# Patient Record
Sex: Male | Born: 1995 | Race: White | Hispanic: No | Marital: Single | Smoking: Never smoker
Health system: Southern US, Community
[De-identification: ages and names within clinical notes are randomized; demographics above are authoritative.]

## PROBLEM LIST (undated history)

## (undated) DIAGNOSIS — G919 Hydrocephalus, unspecified: Secondary | ICD-10-CM

## (undated) DIAGNOSIS — G809 Cerebral palsy, unspecified: Secondary | ICD-10-CM

## (undated) DIAGNOSIS — R278 Other lack of coordination: Secondary | ICD-10-CM

## (undated) DIAGNOSIS — E109 Type 1 diabetes mellitus without complications: Secondary | ICD-10-CM

## (undated) DIAGNOSIS — F419 Anxiety disorder, unspecified: Secondary | ICD-10-CM

## (undated) DIAGNOSIS — Z982 Presence of cerebrospinal fluid drainage device: Secondary | ICD-10-CM

## (undated) DIAGNOSIS — H509 Unspecified strabismus: Secondary | ICD-10-CM

## (undated) HISTORY — DX: Unspecified strabismus: H50.9

## (undated) HISTORY — DX: Other lack of coordination: R27.8

## (undated) HISTORY — PX: VENTRICULOPERITONEAL SHUNT: SHX204

## (undated) HISTORY — DX: Hydrocephalus, unspecified: G91.9

## (undated) HISTORY — PX: TENDON RELEASE: SHX230

## (undated) HISTORY — PX: EYE SURGERY: SHX253

## (undated) HISTORY — DX: Anxiety disorder, unspecified: F41.9

## (undated) HISTORY — DX: Presence of cerebrospinal fluid drainage device: Z98.2

## (undated) HISTORY — DX: Cerebral palsy, unspecified: G80.9

## (undated) HISTORY — DX: Type 1 diabetes mellitus without complications: E10.9

---
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
## 2015-07-15 ENCOUNTER — Emergency Department (HOSPITAL_COMMUNITY): Payer: BLUE CROSS/BLUE SHIELD

## 2015-07-15 ENCOUNTER — Emergency Department (HOSPITAL_COMMUNITY)
Admission: EM | Admit: 2015-07-15 | Discharge: 2015-07-15 | Disposition: A | Discharge status: Home / Self Care | Payer: BLUE CROSS/BLUE SHIELD | Attending: Emergency Medicine | Admitting: Emergency Medicine

## 2015-07-15 ENCOUNTER — Encounter (HOSPITAL_COMMUNITY): Payer: Self-pay | Admitting: Emergency Medicine

## 2015-07-15 DIAGNOSIS — E109 Type 1 diabetes mellitus without complications: Secondary | ICD-10-CM | POA: Insufficient documentation

## 2015-07-15 DIAGNOSIS — Z79899 Other long term (current) drug therapy: Secondary | ICD-10-CM | POA: Insufficient documentation

## 2015-07-15 DIAGNOSIS — Z982 Presence of cerebrospinal fluid drainage device: Secondary | ICD-10-CM | POA: Diagnosis not present

## 2015-07-15 DIAGNOSIS — Z880 Allergy status to penicillin: Secondary | ICD-10-CM | POA: Insufficient documentation

## 2015-07-15 DIAGNOSIS — F419 Anxiety disorder, unspecified: Secondary | ICD-10-CM | POA: Insufficient documentation

## 2015-07-15 DIAGNOSIS — Z794 Long term (current) use of insulin: Secondary | ICD-10-CM | POA: Diagnosis not present

## 2015-07-15 DIAGNOSIS — Z8669 Personal history of other diseases of the nervous system and sense organs: Secondary | ICD-10-CM | POA: Diagnosis not present

## 2015-07-15 DIAGNOSIS — R569 Unspecified convulsions: Secondary | ICD-10-CM | POA: Diagnosis not present

## 2015-07-15 LAB — BASIC METABOLIC PANEL
ANION GAP: 11 (ref 5–15)
BUN: 11 mg/dL (ref 6–20)
CO2: 21 mmol/L — ABNORMAL LOW (ref 22–32)
Calcium: 8.4 mg/dL — ABNORMAL LOW (ref 8.9–10.3)
Chloride: 108 mmol/L (ref 101–111)
Creatinine, Ser: 0.62 mg/dL (ref 0.61–1.24)
GFR calc Af Amer: >60 mL/min (ref >60)
GLUCOSE: 89 mg/dL (ref 65–99)
POTASSIUM: 3.6 mmol/L (ref 3.5–5.1)
SODIUM: 140 mmol/L (ref 135–145)

## 2015-07-15 LAB — CBC WITH DIFFERENTIAL/PLATELET
Basophils Absolute: 0 10*3/uL (ref 0.0–0.1)
Basophils Relative: 0 %
EOS PCT: 0 %
Eosinophils Absolute: 0 10*3/uL (ref 0.0–0.7)
HCT: 47.7 % (ref 39.0–52.0)
HEMOGLOBIN: 17.3 g/dL — AB (ref 13.0–17.0)
LYMPHS PCT: 15 %
Lymphs Abs: 2.1 10*3/uL (ref 0.7–4.0)
MCH: 30.7 pg (ref 26.0–34.0)
MCHC: 36.3 g/dL — ABNORMAL HIGH (ref 30.0–36.0)
MCV: 84.7 fL (ref 78.0–100.0)
MONO ABS: 1.4 10*3/uL — AB (ref 0.1–1.0)
Monocytes Relative: 10 %
NEUTROS PCT: 75 %
Neutro Abs: 10.2 10*3/uL — ABNORMAL HIGH (ref 1.7–7.7)
PLATELETS: 201 10*3/uL (ref 150–400)
RBC: 5.63 MIL/uL (ref 4.22–5.81)
RDW: 12.8 % (ref 11.5–15.5)
WBC: 13.7 10*3/uL — AB (ref 4.0–10.5)

## 2015-07-15 LAB — RAPID URINE DRUG SCREEN, HOSP PERFORMED
AMPHETAMINES: NOT DETECTED
BENZODIAZEPINES: NOT DETECTED
Barbiturates: NOT DETECTED
Cocaine: NOT DETECTED
OPIATES: NOT DETECTED
Tetrahydrocannabinol: NOT DETECTED

## 2015-07-15 LAB — CBG MONITORING, ED
GLUCOSE-CAPILLARY: 226 mg/dL — AB (ref 65–99)
GLUCOSE-CAPILLARY: 140 mg/dL — AB (ref 65–99)
Glucose-Capillary: 104 mg/dL — ABNORMAL HIGH (ref 65–99)
Glucose-Capillary: 100 mg/dL — ABNORMAL HIGH (ref 65–99)
Glucose-Capillary: 118 mg/dL — ABNORMAL HIGH (ref 65–99)

## 2015-07-15 LAB — URINALYSIS, ROUTINE W REFLEX MICROSCOPIC
Bilirubin Urine: NEGATIVE
Glucose, UA: 250 mg/dL — AB
Ketones, ur: 15 mg/dL — AB
LEUKOCYTES UA: NEGATIVE
Nitrite: NEGATIVE
PROTEIN: 100 mg/dL — AB
Specific Gravity, Urine: 1.025 (ref 1.005–1.030)
pH: 5.5 (ref 5.0–8.0)

## 2015-07-15 LAB — URINE MICROSCOPIC-ADD ON: WBC, UA: NONE SEEN WBC/hpf (ref 0–5)

## 2015-07-15 MED ORDER — VALACYCLOVIR HCL 1 G PO TABS: 1000.0000 mg | ORAL_TABLET | Freq: Three times a day (TID) | ORAL | Status: AC

## 2015-07-15 MED ORDER — LEVETIRACETAM 500 MG PO TABS: 500.0000 mg | ORAL_TABLET | Freq: Two times a day (BID) | ORAL | Status: AC

## 2015-07-15 MED ORDER — ACETAMINOPHEN 325 MG PO TABS
650.0000 mg | ORAL_TABLET | Freq: Once | ORAL | Status: AC
  Administered 2015-07-15: 650 mg via ORAL
  Filled 2015-07-15: qty 2

## 2015-07-15 MED ORDER — IOHEXOL 300 MG/ML  SOLN
75.0000 mL | Freq: Once | INTRAMUSCULAR | Status: AC | PRN
  Administered 2015-07-15: 75 mL via INTRAVENOUS

## 2015-07-15 MED ORDER — VALACYCLOVIR HCL 500 MG PO TABS
1000.0000 mg | ORAL_TABLET | Freq: Once | ORAL | Status: AC
  Administered 2015-07-15: 1000 mg via ORAL
  Filled 2015-07-15: qty 2

## 2015-07-15 MED ORDER — SODIUM CHLORIDE 0.9 % IV SOLN
1000.0000 mg | Freq: Once | INTRAVENOUS | Status: AC
  Administered 2015-07-15: 1000 mg via INTRAVENOUS
  Filled 2015-07-15: qty 10

## 2015-07-15 MED ORDER — LORAZEPAM 2 MG/ML IJ SOLN
0.2500 mg | Freq: Once | INTRAMUSCULAR | Status: DC
Start: 2015-07-15 — End: 2015-07-15

## 2015-07-15 NOTE — ED Notes (Signed)
Pt CBG, 100. Nurse was notified. 

## 2015-07-15 NOTE — Discharge Instructions (Signed)
Keppra and Valtrex as prescribed.    Follow-up with neurology in the next week.   Seizure, Adult  A seizure is abnormal electrical activity in the brain. Seizures usually last from 30 seconds to 2 minutes. There are various types of seizures. Before a seizure, you may have a warning sensation (aura) that a seizure is about to occur. An aura may include the following symptoms:   Fear or anxiety.  Nausea.  Feeling like the room is spinning (vertigo).  Vision changes, such as seeing flashing lights or spots. Common symptoms during a seizure include:  A change in attention or behavior (altered mental status).  Convulsions with rhythmic jerking movements.  Drooling.  Rapid eye movements.  Grunting.  Loss of bladder and bowel control.  Bitter taste in the mouth.  Tongue biting. After a seizure, you may feel confused and sleepy. You may also have an injury resulting from convulsions during the seizure. HOME CARE INSTRUCTIONS   If you are given medicines, take them exactly as prescribed by your health care provider.  Keep all follow-up appointments as directed by your health care provider.  Do not swim or drive or engage in risky activity during which a seizure could cause further injury to you or others until your health care provider says it is OK.  Get adequate rest.  Teach friends and family what to do if you have a seizure. They should:  Lay you on the ground to prevent a fall.  Put a cushion under your head.  Loosen any tight clothing around your neck.  Turn you on your side. If vomiting occurs, this helps keep your airway clear.  Stay with you until you recover.  Know whether or not you need emergency care. SEEK IMMEDIATE MEDICAL CARE IF:  The seizure lasts longer than 5 minutes.  The seizure is severe or you do not wake up immediately after the seizure.  You have an altered mental status after the seizure.  You are having more frequent or worsening  seizures. Someone should drive you to the emergency department or call local emergency services (911 in [place] MAKE SURE YOU:  Understand these instructions.  Will watch your condition.  Will get help right away if you are not doing well or get worse.   This information is not intended to replace advice given to you by your health care provider. Make sure you discuss any questions you have with your health care provider.   Document Released: 06/23/2000 Document Revised: 07/17/2014 Document Reviewed: 02/05/2013 Elsevier Interactive Patient Education [employer]2016 [employer]

## 2015-07-15 NOTE — ED Notes (Addendum)
Patient arrived to ED via [facility]. EMS reports patient got out of bed approx 0300, fell down the stairs. +LOC. Patient postictal 12-15 minutes following seizure. Denied neck/back pain. No neuro deficits noted. Patient's pulse 150s upon EMS arrival. Increased to 180s when patient sat up. Decreased to 110s-120s. Patient is diabetic, and has an insulin pump. Mother discontinued insulin pump & has in her possession. 18 gauge IV in L hand. EMS administered NS 500 ml. BP 157/81, Pulse 130, 98% on room air.

## 2015-07-15 NOTE — ED Notes (Signed)
Pt is flushed, warm to touch-- temp- 98.5

## 2015-07-15 NOTE — ED Notes (Signed)
Pt CBG, 140. 

## 2015-07-15 NOTE — Progress Notes (Signed)
Unable to obtain documentation on shunt to determine if the patient could safely have a MRI. Dr Chestine Sporelark did not feel comfortable with us performing the MRI without proper documentation. Dr. Lavon PaganiniNandigam was notified and exam cancelled.

## 2015-07-15 NOTE — Progress Notes (Signed)
EEG Completed; Results Pending  

## 2015-07-15 NOTE — ED Notes (Signed)
Neurologist at bedside, talking with parents and patient.

## 2015-07-15 NOTE — ED Notes (Signed)
Parents brought medical records, MRI notified with lot # -- [id number] catalog [phone]#--[id number]

## 2015-07-15 NOTE — ED Notes (Addendum)
Pt CBG, 118. Nurse was notified. 

## 2015-07-15 NOTE — Consult Note (Signed)
Requesting Physician: Dr.  Vivia EwingNanvati    Reason fo consultation:    To evaluate and manage seizures  HPI:                                                                                                                                         Derek Waters is an 20 y.o. male patient who presented to the emergency room with his parents and grandmother. Patient has a PP shunt catheter in place, it was put in in infancy but is not connected to a peritonial shunt. No history of prior seizures lifetime except for at age 66 weeks infancy had some seizures which led to the shunt catheter placement at that time. Patient is diabetic with an insulin pump. Early this morning, his family had some loud noises in his room. They found him having generalized convulsive seizures lasting 2-3 minutes. When the EMTs arrived blood sugar was reportedly around 120. He is back to baseline by the time he arrived in the ER. Patient denies any new neurological symptoms no recent fevers headaches and neck pain, focal vision speech or sensorimotor symptoms. He has been having some upper history infection for the past few days with feeling of fullness and pain in his ears bilaterally. Denies any facial weakness or vertigo dizziness or gait instability. No recent head trauma.  Other than insulin for diabetes, is not on any other prescription medications. Denies any alcohol use or substance abuse. Lives with his parents at home. Currently does not have any active neurology or neurosurgery follow-up.    Past Medical History  Diagnosis Date  . Premature birth   . Presence of ventricular shunt   . Diabetes mellitus type I (HCC)   . Hydrocephalus   . Cerebral palsy (HCC)   . Anxiety   . Dyspraxia   . Dysgraphia   . Strabismus     Past Surgical History  Procedure Laterality Date  . Ventriculoperitoneal shunt    . Eye surgery    . Tendon release      Family History  Problem Relation Age of Onset  . Diabetes Mother     dx age  20 on pump  . Thyroid disease Mother     hypothyroid  . Hypertension Mother   . OCD Mother   . Hypertension Father   . Thyroid disease Maternal Grandmother     hyperthyroid  . Heart disease Maternal Grandmother     CHF  . Hypertension Maternal Grandmother   . Diabetes Maternal Grandmother     type 2  . Cancer Maternal Grandfather     lung  . Hypertension Maternal Grandfather   . Heart disease Maternal Grandfather     MI x 9 first in 3640s  . Thyroid disease Paternal Grandmother   . Diabetes Paternal Grandfather     type 2  . Hypertension Paternal Grandfather    Social History:  reports that he has never smoked. He has never used smokeless tobacco. His alcohol and drug histories are not on file.  Allergies:  Allergies  Allergen Reactions  . Penicillins Rash    Has patient had a PCN reaction causing immediate rash, facial/tongue/throat swelling, SOB or lightheadedness with hypotension: Yes Has patient had a PCN reaction causing severe rash involving mucus membranes or skin necrosis: No Has patient had a PCN reaction that required hospitalization No Has patient had a PCN reaction occurring within the last 10 years: Yes If all of the above answers are "NO", then may proceed with Cephalosporin use.   Marland Kitchen Amoxicillin Rash    Medications:                                                                                                                          Current facility-administered medications:  .  LORazepam (ATIVAN) injection 0.25 mg, 0.25 mg, Intravenous, Once, Deauna Yaw Daniel Nones, MD  Current outpatient prescriptions:  .  busPIRone (BUSPAR) 30 MG tablet, Take 30 mg by mouth 2 (two) times daily.  , Disp: , Rfl:  .  cetirizine (ZYRTEC) 10 MG tablet, Take 10 mg by mouth daily., Disp: , Rfl:  .  insulin aspart (NOVOLOG) 100 UNIT/ML injection, Use in insulin pump as directed. Please provide #90 day supply., Disp: 45 mL, Rfl: 1   ROS:                                                                                                                                        History obtained from the patient  General ROS: negative for - chills, fatigue, fever, night sweats, weight gain or weight loss Psychological ROS: negative for - behavioral disorder, hallucinations, memory difficulties, mood swings or suicidal ideation Ophthalmic ROS: negative for - blurry vision, double vision, eye pain or loss of vision ENT ROS: Positive for mild sore throat, upper history infection and fullness and pain in the ears , negative for - epistaxis, tinnitus or vertigo Allergy and Immunology ROS: negative for - hives or itchy/watery eyes Hematological and Lymphatic ROS: negative for - bleeding problems, bruising or swollen lymph nodes Endocrine ROS: negative for - galactorrhea, hair pattern changes, polydipsia/polyuria or temperature intolerance Respiratory ROS: negative for - cough, hemoptysis, shortness of breath or wheezing Cardiovascular ROS: negative for - chest pain, dyspnea on exertion, edema or irregular  heartbeat Gastrointestinal ROS: negative for - abdominal pain, diarrhea, hematemesis, nausea/vomiting or stool incontinence Genito-Urinary ROS: negative for - dysuria, hematuria, incontinence or urinary frequency/urgency Musculoskeletal ROS: negative for - joint swelling or muscular weakness Neurological ROS: as noted in HPI Dermatological ROS: negative for rash and skin lesion changes  Neurologic Examination:                                                                                                      Blood pressure 141/84, pulse 111, temperature 98.8 F (37.1 C), temperature source Oral, resp. rate 12, height 5\' 7"  (1.702 m), weight 74.844 kg (165 lb), SpO2 99 %.  Evaluation of higher integrative functions including: Level of alertness: Alert,  Oriented to time, place and person Recent and remote memory - intact   Attention span and concentration  - intact    Speech: fluent, no evidence of dysarthria or aphasia noted.  Test the following cranial nerves: 2-12 grossly intact Motor examination: Normal tone, bulk, full 5/5 motor strength in all 4 extremities Examination of sensation : Normal and symmetric sensation to pinprick in all 4 extremities and on face Examination of deep tendon reflexes: 2+, normal and symmetric in all extremities, normal plantars bilaterally Test coordination: Normal finger nose testing, with no evidence of limb appendicular ataxia or abnormal involuntary movements or tremors noted.  Gait: Deferred   Lab Results: Basic Metabolic Panel:  Recent Labs Lab 07/15/15 0548  NA 140  K 3.6  CL 108  CO2 21*  GLUCOSE 89  BUN 11  CREATININE 0.62  CALCIUM 8.4*    Liver Function Tests: No results for input(s): AST, ALT, ALKPHOS, BILITOT, PROT, ALBUMIN in the last 168 hours. No results for input(s): LIPASE, AMYLASE in the last 168 hours. No results for input(s): AMMONIA in the last 168 hours.  CBC:  Recent Labs Lab 07/15/15 0548  WBC 13.7*  NEUTROABS 10.2*  HGB 17.3*  HCT 47.7  MCV 84.7  PLT 201    Cardiac Enzymes: No results for input(s): CKTOTAL, CKMB, CKMBINDEX, TROPONINI in the last 168 hours.  Lipid Panel: No results for input(s): CHOL, TRIG, HDL, CHOLHDL, VLDL, LDLCALC in the last 168 hours.  CBG:  Recent Labs Lab 07/15/15 0528 07/15/15 0748 07/15/15 0859  GLUCAP 104* 100* 118*    Microbiology: No results found for this or any previous visit.  aging: Dg Skull 1-3 Views  07/15/2015  CLINICAL DATA:  Seizure.  Shunt series EXAM: SKULL - 1-3 VIEW COMPARISON:  None. FINDINGS: Right parietal shunt tubing. There is a lucent valve overlying the right temporal bone. There is a lucent segment at the C2 level as described on the cervical spine x-ray series. IMPRESSION: Lucent segment of shunt catheter at the C2 level may be a fractured segment of catheter. Electronically Signed   By: Marlan Palau M.D.    On: 07/15/2015 07:35   Dg Chest 1 View  07/15/2015  CLINICAL DATA:  Seizure.  Shunt series EXAM: CHEST 1 VIEW COMPARISON:  None. FINDINGS: Shunt tubing over the right chest with calcification around the tubing. Lucent  segment of tubing is present which may be due to a valve or possibly disconnected tubing. This overlies the right mid chest. Lungs are clear without infiltrate or effusion. Heart size is normal. IMPRESSION: Calcification around the shunt tubing in the right chest. Lucent segment in the tubing may represent a valve or disconnected tubing. No prior studies available for comparison. Electronically Signed   By: Marlan Palau M.D.   On: 07/15/2015 07:30   Dg Cervical Spine 1 View  07/15/2015  CLINICAL DATA:  Seizure.  Shunt series EXAM: CERVICAL SPINE 1 VIEW COMPARISON:  None. FINDINGS: Shunt tubing overlies the right neck. There is a valve overlying the right temporal bone. There is a lucent segment of tubing at the C2 level which may be disconnected or fractured tubing. No acute bony abnormality. IMPRESSION: Lucent segment of tubing at the C2 level may be disconnected or fractured tubing. Electronically Signed   By: Marlan Palau M.D.   On: 07/15/2015 07:32   Dg Abd 1 View  07/15/2015  CLINICAL DATA:  Status post fall at 3 a.m. this morning. Patient reports loss of consciousness. Ventriculostomy shunt catheter. EXAM: ABDOMEN - 1 VIEW COMPARISON:  None. FINDINGS: Ventriculostomy shunt catheter tip is seen in the left upper quadrant of the abdomen projecting over the left eleventh rib. Imaged tubing is intact. Bowel gas pattern is normal. IMPRESSION: Shunt catheter tubing projects in the left upper quadrant the abdomen and is intact. Electronically Signed   By: Drusilla Kanner M.D.   On: 07/15/2015 07:34   Ct Head Wo Contrast  07/15/2015  CLINICAL DATA:  Seizure EXAM: CT HEAD WITHOUT CONTRAST TECHNIQUE: Contiguous axial images were obtained from the base of the skull through the vertex without  intravenous contrast. COMPARISON:  None. FINDINGS: Right parietal ventricular shunt catheter extends into the right lateral ventricle. There is volume loss in the right parietal white matter with enlargement of the atrium of the right lateral ventricle compatible with chronic periventricular leukomalacia. Negative for hydrocephalus Negative for acute infarct.  Negative for hemorrhage or mass lesion. Calvarium intact. IMPRESSION: No acute abnormality.  No prior studies available for comparison. Right parietal ventricular catheter in the right lateral ventricle. 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 Electronically Signed   By: Marlan Palau M.D.   On: 07/15/2015 07:18     Assessment and plan:   Derek Waters is an 20 y.o. male patient who presented to the emergency room after a convulsive generalized seizure this morning witnessed by his parents. He does have increased risk for seizures based on the previous ventricular catheter placement with some mild encephalomalacia around the catheter noted on the CT brain. This was placed at age 37 weeks, apparently had a seizure at that time and was believed to have hydrocephalus. But the catheter is not connected to any paratonia of shunt and it ends blindly in the neck. Patient had seizures previously to his knowledge, currently not on any antiepileptic medications, no active neurology or neurosurgery follow-up.  The potential trigger for this seizure is not clearly known. He reported having upper history infection with pain and fullness in bilateral ear canals. 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 Parents reported that it is not unusual for him to stay up late at night's with some sleep deprivation, although denied any significant stressors or sleep deprivation recently. Given the risk of seizures based on the intracranial pathology, recommend a loading dose of Keppra 1 g to be given in the  ER, and a  prescription for Keppra 500 mg twice a day to be taken at home. He is advised to follow-up with outpatient neurology for continued monitoring of the seizure medication. Recommend an EEG and MRI of the brain with and without contrast for further neurodiagnostic workup.  EEG has been completed in the ER, which did not show any definite evidence of abnormal epileptiform discharges or electrographic seizures. MRI is still pending. 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  If MRI does not show any significant intracranial pathology, patient can be discharged home from the ER in stable condition, with a prescription for Keppra 500 mg twice a day, and follow-up with outpatient neurology in 1-2 weeks. No driving for at least 6 months until cleared by his neurologist or PCP.  Case discussed with ER physician.

## 2015-07-15 NOTE — ED Notes (Signed)
Pt CBG, 226. 

## 2015-07-15 NOTE — ED Notes (Signed)
Pt returned from xray-- parents at bedside,

## 2015-07-15 NOTE — Procedures (Signed)
ELECTROENCEPHALOGRAM REPORT   Patient: Derek Waters      Age: 20 y.o.        Sex: male Referring Physician: Dr Judd Lienelo ED Report Date:  07/15/2015        Interpreting Physician: Omelia BlackwaterSUMNER, Wreatha Sturgeon JUSTIN  History: Derek ShawlWilliam Camire is an 20 y.o. male admitted with seizure activity, currently back to baseline  Medications:  I have reviewed the patient's current medications.  Conditions of Recording:  This is a 16 channel EEG carried out with the patient in the awake state.  Description:  The waking background activity consists of a low voltage, symmetrical, fairly well organized, 8 to 10 hx alpha activity, seen from the parieto-occipital and posterior temporal regions.  Low voltage fast activity, poorly organized, is seen anteriorly and is at times superimposed on more posterior regions. No focal slowing or epileptiform activity is noted.   Hyperventilation produced a mild to moderate buildup but failed to elicit any abnormalities. Intermittent photic stimulation was performed but failed to illicit any change in the tracing.     IMPRESSION: Normal electroencephalogram. There are no focal lateralizing or epileptiform features.   Elspeth Choeter Rommel Hogston, DO [facility] [phone]  If 7pm- 7am, please page neurology on call as listed in [facility]ION. 07/15/2015, 11:44 AM

## 2015-07-17 NOTE — ED Provider Notes (Signed)
Care assumed at shift change. Patient presented here after a seizure. He has a history of a VP shunt that was placed when he was an infant and has not been revised since. His workup reveals no abnormality thus far. In consultation with neurology, he has undergone an EEG which was unremarkable. He was also to undergo an MRI, however this test is not compatible with the type of shunt patient has. For this reason he underwent a CT scan of the head with contrast. This was negative. Per Dr. Lavon PaganiniNandigam, he is appropriate for discharge.  Geoffery Lyonsouglas Zyionna Pesce, MD 07/17/15 [phone]

## 2015-07-23 NOTE — ED Provider Notes (Signed)
CSN: [id number]     Arrival date & time 07/15/15  [id number] History   First MD Initiated Contact with Patient 07/15/15 [phone]     Chief Complaint  Patient presents with  . Seizures     (Consider location/radiation/quality/duration/timing/severity/associated sxs/prior Treatment) HPI Comments: 20 y.o. male patient who presented to the emergency room with his parents with cc of seizure. 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 No history of prior seizures lifetime except for at age 60 weeks infancy had some seizures which led to the shunt catheter placement at that time. Mother reports that early in the morning she heard a loud noise, and when she went to check the patient was having generalized seizure like activity lasting 2-3 minutes. When the EMTs arrived blood sugar was reportedly around 120. He was confused initially, but now he is back to baseline.   Patient reports waking up and feeling like his sugar was low. He reports recalling making his way to the kitchen to drink something prior to the episode. Patient denies any new neurological symptoms no recent fevers headaches and neck pain, focal vision speech or sensorimotor symptoms. Denies any facial weakness or vertigo dizziness or gait instability. No recent head trauma.  Other than insulin for diabetes, is not on any other prescription medications. Denies any alcohol use or substance abuse.   ROS 10 Systems reviewed and are negative for acute change except as noted in the HPI.     Patient is a 20 y.o. male presenting with seizures. The history is provided by the patient and a parent.  Seizures   Past Medical History  Diagnosis Date  . Premature birth   . Presence of ventricular shunt   . Diabetes mellitus type I (HCC)   . Hydrocephalus   . Cerebral palsy (HCC)   . Anxiety   . Dyspraxia   . Dysgraphia   . Strabismus    Past Surgical History  Procedure  Laterality Date  . Ventriculoperitoneal shunt    . Eye surgery    . Tendon release     Family History  Problem Relation Age of Onset  . Diabetes Mother     dx age 20 on pump  . Thyroid disease Mother     hypothyroid  . Hypertension Mother   . OCD Mother   . Hypertension Father   . Thyroid disease Maternal Grandmother     hyperthyroid  . Heart disease Maternal Grandmother     CHF  . Hypertension Maternal Grandmother   . Diabetes Maternal Grandmother     type 2  . Cancer Maternal Grandfather     lung  . Hypertension Maternal Grandfather   . Heart disease Maternal Grandfather     MI x 9 first in 7540s  . Thyroid disease Paternal Grandmother   . Diabetes Paternal Grandfather     type 2  . Hypertension Paternal Grandfather    Social History  Substance Use Topics  . Smoking status: Never Smoker   . Smokeless tobacco: Never Used  . Alcohol Use: None    Review of Systems  Neurological: Positive for seizures.      Allergies  Penicillins and Amoxicillin  Home Medications   Prior to Admission medications   Medication Sig Start Date End Date Taking? Authorizing Provider  busPIRone (BUSPAR) 30 MG tablet Take 30 mg by mouth 2 (two) times daily.     Yes  Historical Provider, MD  cetirizine (ZYRTEC) 10 MG tablet Take 10 mg by mouth daily.   Yes Historical Provider, MD  insulin aspart (NOVOLOG) 100 UNIT/ML injection Use in insulin pump as directed. Please provide #90 day supply. 10/27/11   David Stall, MD  levETIRAcetam (KEPPRA) 500 MG tablet Take 1 tablet (500 mg total) by mouth 2 (two) times daily. 07/15/15   Geoffery Lyons, MD  valACYclovir (VALTREX) 1000 MG tablet Take 1 tablet (1,000 mg total) by mouth 3 (three) times daily. 07/15/15 07/29/15  Geoffery Lyons, MD   BP 109/59 mmHg  Pulse 98  Temp(Src) 98.8 F (37.1 C) (Oral)  Resp 22  Ht 5\' 7"  (1.702 m)  Wt 165 lb (74.844 kg)  BMI 25.84 kg/m2  SpO2 97% Physical Exam  Constitutional: He is oriented to person, place, and  time. He appears well-developed and well-nourished.  HENT:  Head: Normocephalic and atraumatic.  Eyes: EOM are normal. Pupils are equal, round, and reactive to light.  Neck: Normal range of motion. Neck supple. No JVD present.  Cardiovascular: Normal rate and regular rhythm.   Pulmonary/Chest: Effort normal and breath sounds normal. No respiratory distress. He has no wheezes.  Abdominal: Soft. Bowel sounds are normal. He exhibits no distension. There is no tenderness. There is no rebound and no guarding.  Neurological: He is alert and oriented to person, place, and time. No cranial nerve deficit. Coordination normal.  NIHSS - 0 No objective sensory deficits, Motor strength upper and lower extremity 4+ and equal Normal cerebellar exam  Skin: Skin is warm and dry.  Vitals reviewed.   ED Course  Procedures (including critical care time) Labs Review Labs Reviewed  CBC WITH DIFFERENTIAL/PLATELET - Abnormal; Notable for the following:    WBC 13.7 (*)    Hemoglobin 17.3 (*)    MCHC 36.3 (*)    Neutro Abs 10.2 (*)    Monocytes Absolute 1.4 (*)    All other components within normal limits  BASIC METABOLIC PANEL - Abnormal; Notable for the following:    CO2 21 (*)    Calcium 8.4 (*)    All other components within normal limits  URINALYSIS, ROUTINE W REFLEX MICROSCOPIC (NOT AT [facility]) - Abnormal; Notable for the following:    APPearance CLOUDY (*)    Glucose, UA 250 (*)    Hgb urine dipstick SMALL (*)    Ketones, ur 15 (*)    Protein, ur 100 (*)    All other components within normal limits  URINE MICROSCOPIC-ADD ON - Abnormal; Notable for the following:    Squamous Epithelial / LPF 0-5 (*)    Bacteria, UA RARE (*)    Casts HYALINE CASTS (*)    All other components within normal limits  CBG MONITORING, ED - Abnormal; Notable for the following:    Glucose-Capillary 104 (*)    All other components within normal limits  CBG MONITORING, ED - Abnormal; Notable for the following:     Glucose-Capillary 100 (*)    All other components within normal limits  CBG MONITORING, ED - Abnormal; Notable for the following:    Glucose-Capillary 118 (*)    All other components within normal limits  CBG MONITORING, ED - Abnormal; Notable for the following:    Glucose-Capillary 226 (*)    All other components within normal limits  CBG MONITORING, ED - Abnormal; Notable for the following:    Glucose-Capillary 140 (*)    All other components within normal limits  URINE RAPID DRUG  SCREEN, HOSP PERFORMED    Imaging Review No results found. I have personally reviewed and evaluated these images and lab results as part of my medical decision-making.   EKG Interpretation None      MDM   Final diagnoses:  Seizure (HCC)  S/P VP shunt   Pt comes in with cc of seizure like episode, witnessed. Hx of iddm and non functional VP shunt whi was placed when he was a neonate for a seizure like activity and hydrocephalus.  DDx: -Seizure disorder -Meningitis -Trauma -ICH -Electrolyte abnormality -Metabolic derangement -Stroke -Toxin induced seizures -Medication side effects -Hypoxia -Hypoglycemia   Hx and exam not indicative of any source. Neuro consulted and basic labs ordered. 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 - Neuro recs are to start keppra and valtrex. They will be getting EEG.  Dr. Judd Lien to f/u on the eventual rects. CT head and shunt series ordered - and dont show any acute pathology.    Derwood Kaplan, MD 07/23/15 1910

## 2015-08-19 ENCOUNTER — Ambulatory Visit: Payer: Commercial Indemnity | Admitting: Neurology

## 2016-07-22 IMAGING — CT CT HEAD W/ CM
1 of 2 series · 16 of 30 positions shown, 20 images · IV contrast (omnipaque)
Comparison: CT head without contrast earlier today.

CLINICAL DATA: Seizure today. Slight headache and dizziness.
History of hydrocephalus with cerebral palsy.

EXAM:
CT HEAD WITH CONTRAST
TECHNIQUE: Contiguous axial images were obtained from the base of the skull
through the vertex with intravenous contrast.
CONTRAST:  75mL OMNIPAQUE IOHEXOL 300 MG/ML  SOLN

[Series 2: head with · axial · 0.45mm/px · z∈[-174,-40]mm · 16 of 31 slices shown, 20 images]
[im 2/31  brain]
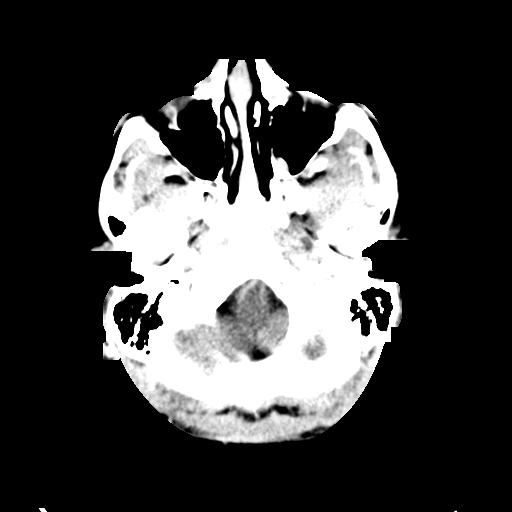
[im 2/31  bone]
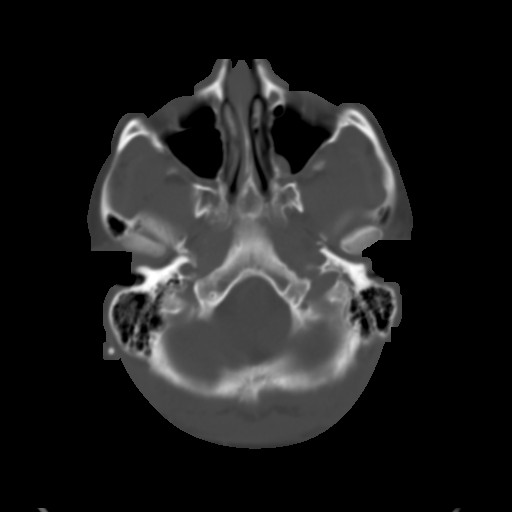
[im 4/31  brain]
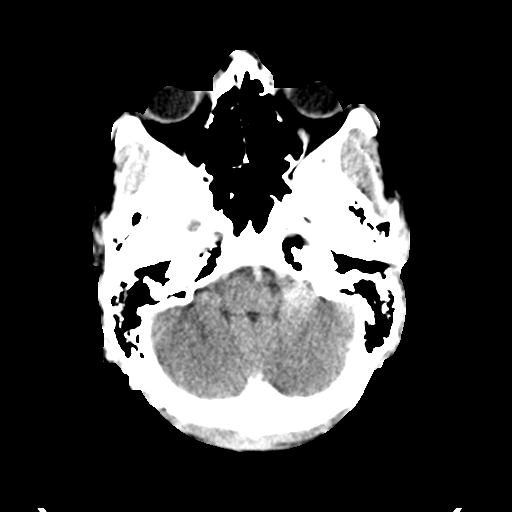
[im 5/31  brain]
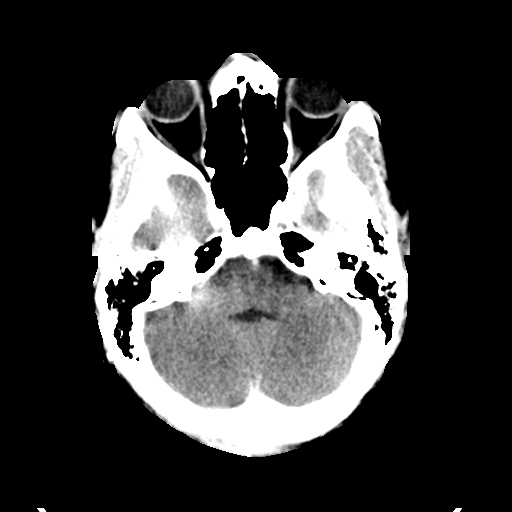
[im 7/31  brain]
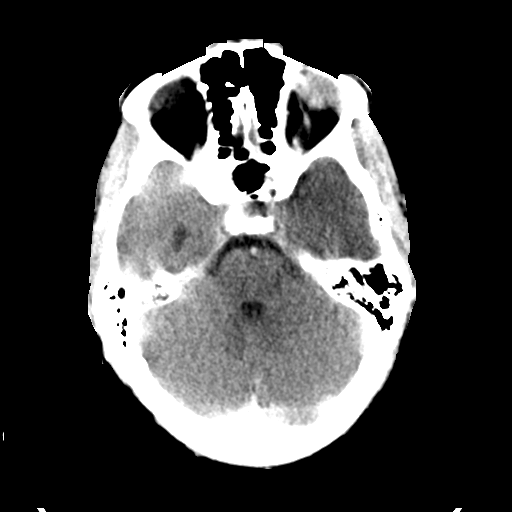
[im 10/31  brain]
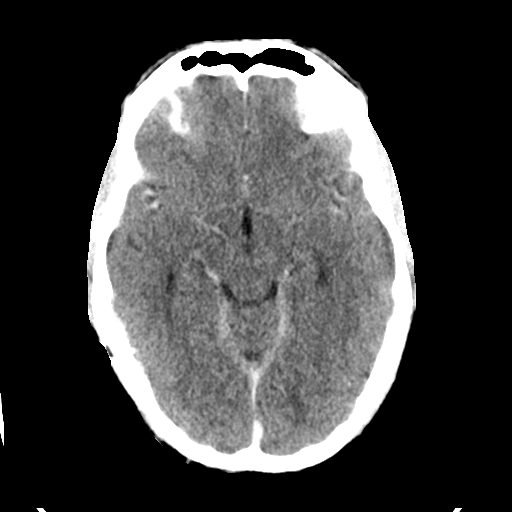
[im 10/31  bone]
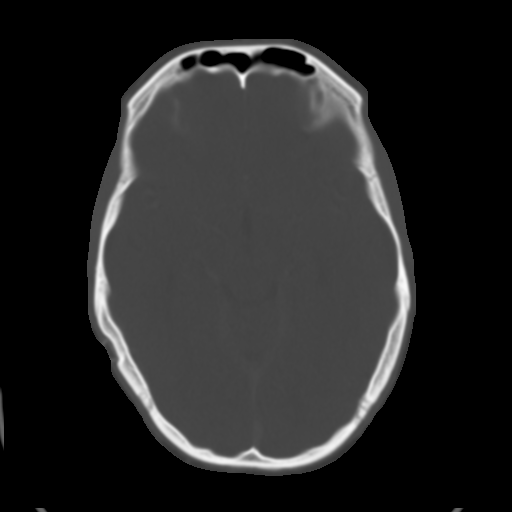
[im 12/31  brain]
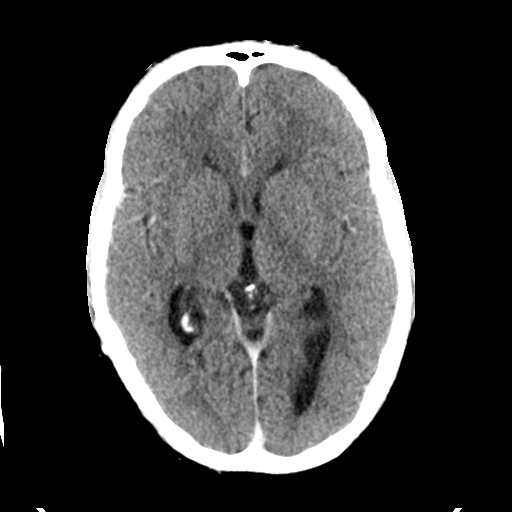
[im 13/31  brain]
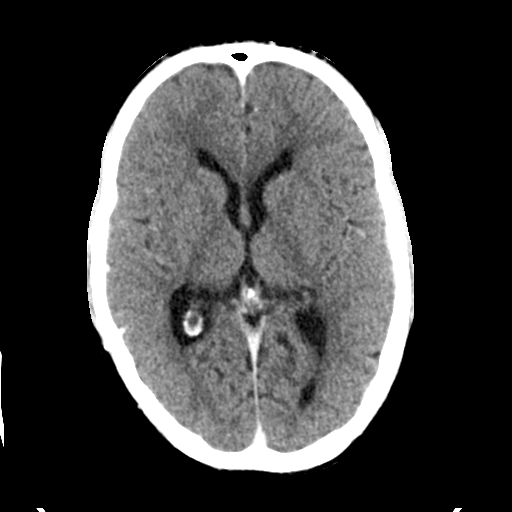
[im 15/31  brain]
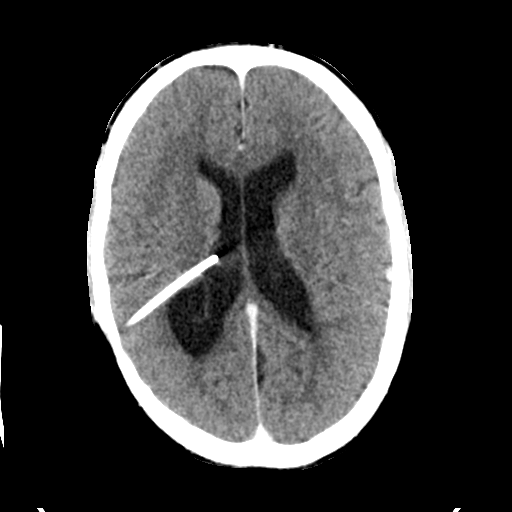
[im 16/31  brain]
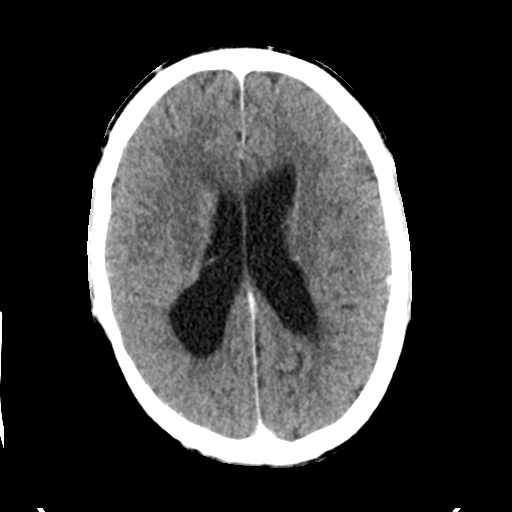
[im 16/31  bone]
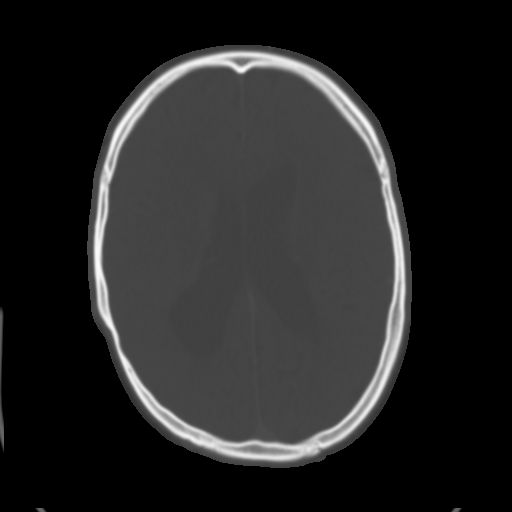
[im 18/31  brain]
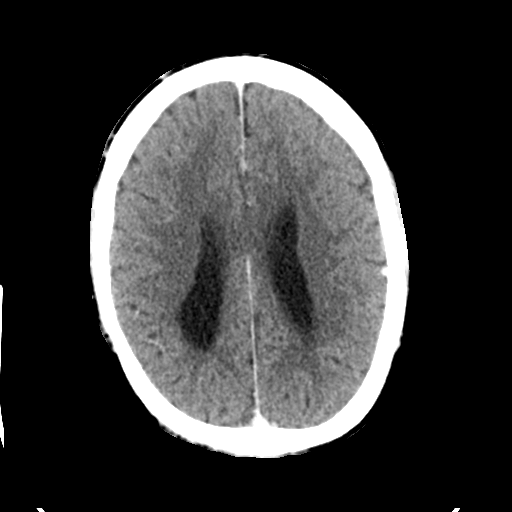
[im 19/31  brain]
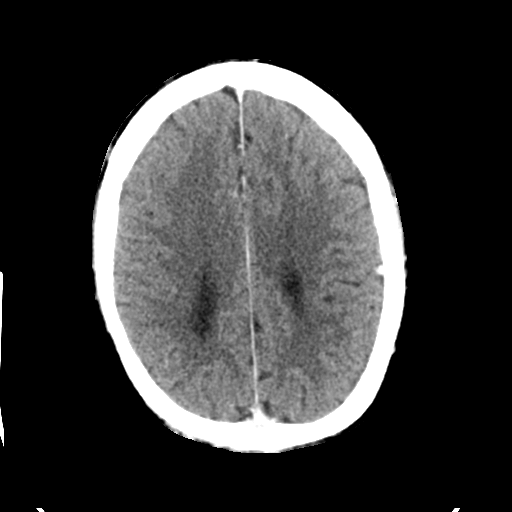
[im 21/31  brain]
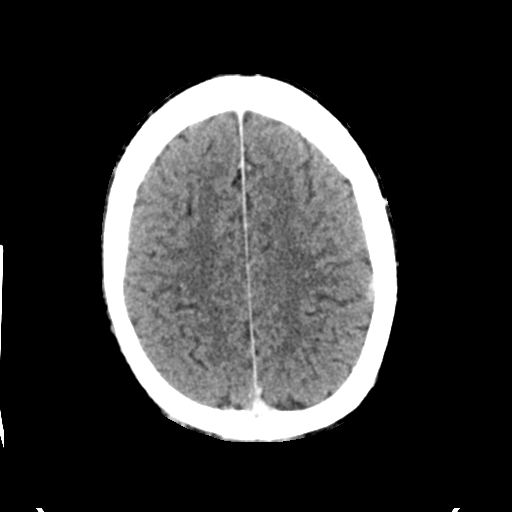
[im 24/31  brain]
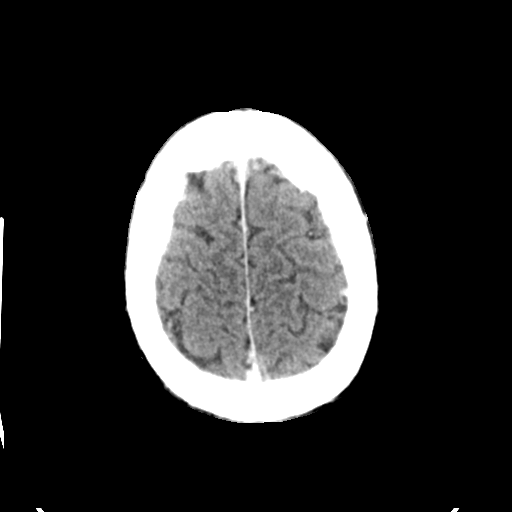
[im 24/31  bone]
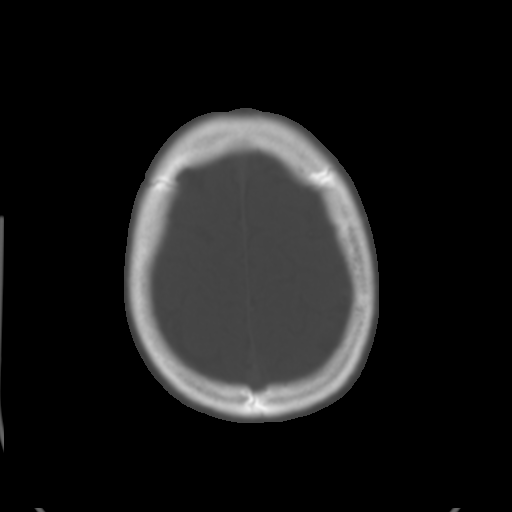
[im 26/31  brain]
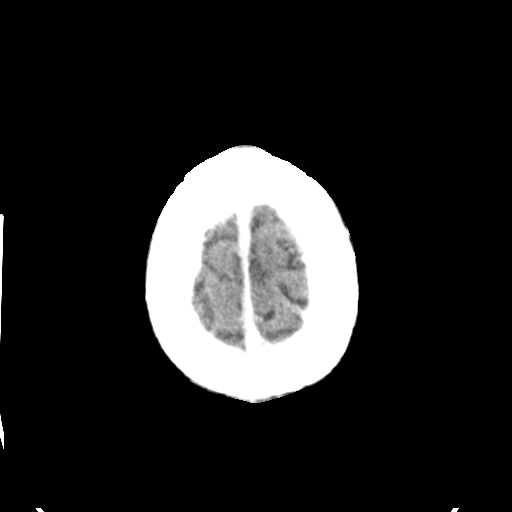
[im 27/31  brain]
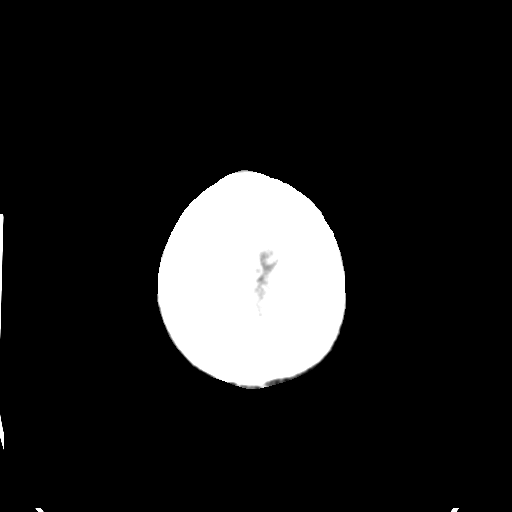
[im 29/31  brain]
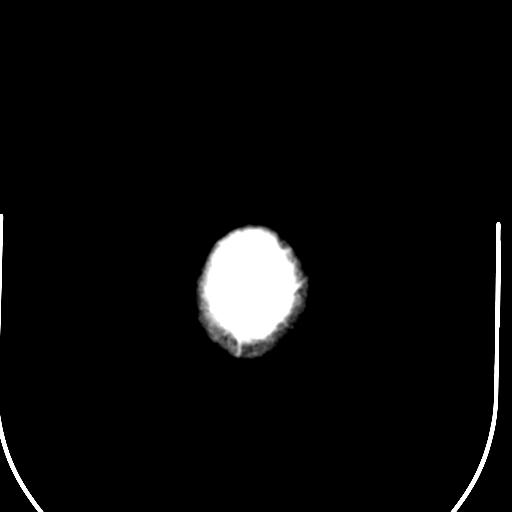

[16 of 30 positions shown; findings below may reference images not displayed]

FINDINGS: Post infusion, no abnormal enhancement of the brain or meninges.
Uncomplicated appearing RIGHT parietal shunt catheter, lying with
its tip in the RIGHT lateral ventricle. Moderate ventriculomegaly
could represent central atrophy, particularly with the prominence of
the parieto-occipital atria.

Extracranial soft tissues unremarkable. No obvious shunt tubing
discontinuity.
IMPRESSION: Unremarkable post infusion CT scan of the head.

Suspected changes of central atrophy with parieto-occipital white
matter loss consistent with the history of cerebral palsy.

## 2022-09-21 ENCOUNTER — Emergency Department (HOSPITAL_BASED_OUTPATIENT_CLINIC_OR_DEPARTMENT_OTHER)
Admission: EM | Admit: 2022-09-21 | Discharge: 2022-09-21 | Disposition: A | Discharge status: Home / Self Care | Payer: BC Managed Care – PPO | Attending: Emergency Medicine | Admitting: Emergency Medicine

## 2022-09-21 ENCOUNTER — Encounter (HOSPITAL_BASED_OUTPATIENT_CLINIC_OR_DEPARTMENT_OTHER): Payer: Self-pay

## 2022-09-21 ENCOUNTER — Emergency Department (HOSPITAL_BASED_OUTPATIENT_CLINIC_OR_DEPARTMENT_OTHER): Payer: BC Managed Care – PPO

## 2022-09-21 ENCOUNTER — Other Ambulatory Visit: Payer: Self-pay

## 2022-09-21 ENCOUNTER — Other Ambulatory Visit (HOSPITAL_BASED_OUTPATIENT_CLINIC_OR_DEPARTMENT_OTHER): Payer: Self-pay

## 2022-09-21 DIAGNOSIS — K529 Noninfective gastroenteritis and colitis, unspecified: Secondary | ICD-10-CM | POA: Diagnosis not present

## 2022-09-21 DIAGNOSIS — F419 Anxiety disorder, unspecified: Secondary | ICD-10-CM | POA: Diagnosis not present

## 2022-09-21 DIAGNOSIS — R519 Headache, unspecified: Secondary | ICD-10-CM | POA: Insufficient documentation

## 2022-09-21 DIAGNOSIS — E119 Type 2 diabetes mellitus without complications: Secondary | ICD-10-CM | POA: Insufficient documentation

## 2022-09-21 DIAGNOSIS — Z20822 Contact with and (suspected) exposure to covid-19: Secondary | ICD-10-CM | POA: Diagnosis not present

## 2022-09-21 DIAGNOSIS — R000 Tachycardia, unspecified: Secondary | ICD-10-CM | POA: Diagnosis not present

## 2022-09-21 DIAGNOSIS — Z794 Long term (current) use of insulin: Secondary | ICD-10-CM | POA: Insufficient documentation

## 2022-09-21 DIAGNOSIS — R112 Nausea with vomiting, unspecified: Secondary | ICD-10-CM | POA: Diagnosis present

## 2022-09-21 DIAGNOSIS — Z8669 Personal history of other diseases of the nervous system and sense organs: Secondary | ICD-10-CM | POA: Diagnosis not present

## 2022-09-21 LAB — CBC
HCT: 45.8 % (ref 39.0–52.0)
Hemoglobin: 16 g/dL (ref 13.0–17.0)
MCH: 29.5 pg (ref 26.0–34.0)
MCHC: 34.9 g/dL (ref 30.0–36.0)
MCV: 84.5 fL (ref 80.0–100.0)
Platelets: 263 10*3/uL (ref 150–400)
RBC: 5.42 MIL/uL (ref 4.22–5.81)
RDW: 12.8 % (ref 11.5–15.5)
WBC: 3.3 10*3/uL — ABNORMAL LOW (ref 4.0–10.5)
nRBC: 0 % (ref 0.0–0.2)

## 2022-09-21 LAB — COMPREHENSIVE METABOLIC PANEL
ALT: 15 U/L (ref 0–44)
AST: 11 U/L — ABNORMAL LOW (ref 15–41)
Albumin: 4.4 g/dL (ref 3.5–5.0)
Alkaline Phosphatase: 62 U/L (ref 38–126)
Anion gap: 13 (ref 5–15)
BUN: 10 mg/dL (ref 6–20)
CO2: 19 mmol/L — ABNORMAL LOW (ref 22–32)
Calcium: 9.1 mg/dL (ref 8.9–10.3)
Chloride: 101 mmol/L (ref 98–111)
Creatinine, Ser: 0.78 mg/dL (ref 0.61–1.24)
GFR, Estimated: >60 mL/min (ref >60)
Glucose, Bld: 304 mg/dL — ABNORMAL HIGH (ref 70–99)
Potassium: 3.9 mmol/L (ref 3.5–5.1)
Sodium: 133 mmol/L — ABNORMAL LOW (ref 135–145)
Total Bilirubin: 0.8 mg/dL (ref 0.3–1.2)
Total Protein: 7.2 g/dL (ref 6.5–8.1)

## 2022-09-21 LAB — RESP PANEL BY RT-PCR (RSV, FLU A&B, COVID)  RVPGX2
Influenza A by PCR: NEGATIVE
Influenza B by PCR: NEGATIVE
Resp Syncytial Virus by PCR: NEGATIVE
SARS Coronavirus 2 by RT PCR: NEGATIVE

## 2022-09-21 LAB — URINALYSIS, ROUTINE W REFLEX MICROSCOPIC
Bacteria, UA: NONE SEEN
Bilirubin Urine: NEGATIVE
Glucose, UA: >1000 mg/dL — AB
Hgb urine dipstick: NEGATIVE
Ketones, ur: >80 mg/dL — AB
Leukocytes,Ua: NEGATIVE
Nitrite: NEGATIVE
Protein, ur: 30 mg/dL — AB
Specific Gravity, Urine: 1.033 — ABNORMAL HIGH (ref 1.005–1.030)
pH: 5.5 (ref 5.0–8.0)

## 2022-09-21 LAB — CBG MONITORING, ED
Glucose-Capillary: 299 mg/dL — ABNORMAL HIGH (ref 70–99)
Glucose-Capillary: 263 mg/dL — ABNORMAL HIGH (ref 70–99)
Glucose-Capillary: 230 mg/dL — ABNORMAL HIGH (ref 70–99)

## 2022-09-21 LAB — LIPASE, BLOOD: Lipase: <10 U/L — ABNORMAL LOW (ref 11–51)

## 2022-09-21 MED ORDER — MORPHINE SULFATE (PF) 4 MG/ML IV SOLN
4.0000 mg | Freq: Once | INTRAVENOUS | Status: AC
  Administered 2022-09-21: 4 mg via INTRAVENOUS
  Filled 2022-09-21: qty 1

## 2022-09-21 MED ORDER — IOHEXOL 350 MG/ML SOLN
100.0000 mL | Freq: Once | INTRAVENOUS | Status: AC | PRN
  Administered 2022-09-21: 80 mL via INTRAVENOUS

## 2022-09-21 MED ORDER — LACTATED RINGERS IV BOLUS
1000.0000 mL | Freq: Once | INTRAVENOUS | Status: AC
  Administered 2022-09-21: 1000 mL via INTRAVENOUS

## 2022-09-21 MED ORDER — LACTATED RINGERS IV BOLUS
2000.0000 mL | Freq: Once | INTRAVENOUS | Status: AC
  Administered 2022-09-21: 2000 mL via INTRAVENOUS

## 2022-09-21 MED ORDER — METOCLOPRAMIDE HCL 5 MG/ML IJ SOLN
10.0000 mg | Freq: Once | INTRAMUSCULAR | Status: AC
  Administered 2022-09-21: 10 mg via INTRAVENOUS
  Filled 2022-09-21: qty 2

## 2022-09-21 MED ORDER — ONDANSETRON HCL 4 MG/2ML IJ SOLN
4.0000 mg | Freq: Once | INTRAMUSCULAR | Status: AC
  Administered 2022-09-21: 4 mg via INTRAVENOUS
  Filled 2022-09-21: qty 2

## 2022-09-21 NOTE — ED Notes (Signed)
Given water for PO challenge 

## 2022-09-21 NOTE — ED Provider Notes (Signed)
De[facility] Provider Note   CSN: [id number] Arrival date & time: 09/21/22  1445     History  No chief complaint on file.   Derek Waters is a 27 y.o. male.  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  Patient reports that 3 days ago started experiencing the above symptoms.  Also did have congestion and cough and went to his primary doctor and was given Zofran and had negative COVID and flu test there as well as a negative strep test.  Says that his nausea and vomiting have persisted.  Does think that there was some blood in his vomiting that was nonbilious.  Has had several episodes of diarrhea that are nonmelanotic and nonbloody.  Says that he had diffuse abdominal pain that started around the time of his symptoms.  Says he is feeling very anxious right now as well and wanted to come into the emergency department for evaluation.  Says he has had a mild headache since his symptoms started.  Says that his VP shunt is not connected right now because his hydrocephalus had improved.  Denies any vision changes or numbness or weakness of his arms or legs.  Did have a fever of 100.6 F at home that was taken orally.  Has been taking Tylenol since this illness started but does not report any today.  Denies any marijuana use or other recreational drug use.       Home Medications Prior to Admission medications   Medication Sig Start Date End Date Taking? Authorizing Provider  busPIRone (BUSPAR) 30 MG tablet Take 30 mg by mouth 2 (two) times daily.      [provider]  cetirizine (ZYRTEC) 10 MG tablet Take 10 mg by mouth daily.    [provider]  insulin aspart (NOVOLOG) 100 UNIT/ML injection Use in insulin pump as directed. Please provide #90 day supply. 10/27/11   Sherrlyn Hock, MD  levETIRAcetam (KEPPRA) 500 MG tablet Take 1 tablet (500 mg total) by mouth 2 (two) times daily. 07/15/15   Veryl Speak, MD      Allergies    Penicillins and Amoxicillin    Review of Systems   Review of Systems  Physical Exam Updated Vital Signs BP 135/67   Pulse (!) 129   Temp 98.6 F (37 C)   Resp (!) 33   Ht '5\' 9"'$  (1.753 m)   Wt 95.3 kg   SpO2 96%   BMI 31.01 kg/m  Physical Exam Vitals and nursing note reviewed.  Constitutional:      General: He is not in acute distress.    Appearance: He is well-developed.     Comments: Anxious appearing  HENT:     Head: Normocephalic and atraumatic.     Right Ear: External ear normal.     Left Ear: External ear normal.     Nose: Nose normal.  Eyes:     Extraocular Movements: Extraocular movements intact.     Conjunctiva/sclera: Conjunctivae normal.     Pupils: Pupils are equal, round, and reactive to light.  Cardiovascular:     Rate and Rhythm: Regular rhythm. Tachycardia present.  Pulmonary:     Effort: Pulmonary effort is normal. No respiratory distress.  Abdominal:     General: There is no distension.     Palpations: Abdomen is soft. There is no  mass.     Tenderness: There is no abdominal tenderness. There is no guarding.  Musculoskeletal:     Cervical back: Normal range of motion and neck supple.     Right lower leg: No edema.     Left lower leg: No edema.  Skin:    General: Skin is warm and dry.  Neurological:     Mental Status: He is alert. Mental status is at baseline.  Psychiatric:        Mood and Affect: Mood normal.        Behavior: Behavior normal.     ED Results / Procedures / Treatments   Labs (all labs ordered are listed, but only abnormal results are displayed) Labs Reviewed  LIPASE, BLOOD - Abnormal; Notable for the following components:      Result Value   Lipase <10 (*)    All other components within normal limits  COMPREHENSIVE METABOLIC PANEL - Abnormal; Notable for the  following components:   Sodium 133 (*)    CO2 19 (*)    Glucose, Bld 304 (*)    AST 11 (*)    All other components within normal limits  CBC - Abnormal; Notable for the following components:   WBC 3.3 (*)    All other components within normal limits  URINALYSIS, ROUTINE W REFLEX MICROSCOPIC - Abnormal; Notable for the following components:   Specific Gravity, Urine 1.033 (*)    Glucose, UA >1,000 (*)    Ketones, ur >80 (*)    Protein, ur 30 (*)    All other components within normal limits  CBG MONITORING, ED - Abnormal; Notable for the following components:   Glucose-Capillary 299 (*)    All other components within normal limits  CBG MONITORING, ED - Abnormal; Notable for the following components:   Glucose-Capillary 263 (*)    All other components within normal limits  CBG MONITORING, ED - Abnormal; Notable for the following components:   Glucose-Capillary 230 (*)    All other components within normal limits  RESP PANEL BY RT-PCR (RSV, FLU A&B, COVID)  [id number]    EKG EKG Interpretation  Date/Time:  Thursday September 21 2022 14:53:45 EDT Ventricular Rate:  139 PR Interval:  122 QRS Duration: 72 QT Interval:  264 QTC Calculation: 401 R Axis:   44 Text Interpretation: Sinus tachycardia Nonspecific T wave abnormality Abnormal ECG No previous ECGs available Confirmed by Margaretmary Eddy [id number]) on 09/21/2022 4:05:31 PM  Radiology CT ABDOMEN PELVIS W CONTRAST  Result Date: 09/21/2022 CLINICAL DATA:  Nausea, vomiting, diarrhea, fever, abdominal pain, and back pain for 2 days EXAM: CT ABDOMEN AND PELVIS WITH CONTRAST TECHNIQUE: Multidetector CT imaging of the abdomen and pelvis was performed using the standard protocol following bolus administration of intravenous contrast. RADIATION DOSE REDUCTION: This exam was performed according to the departmental dose-optimization program which includes automated exposure control, adjustment of the mA and/or kV according to patient size and/or use  of iterative reconstruction technique. CONTRAST:  60m OMNIPAQUE IOHEXOL 350 MG/ML SOLN IV. No oral contrast. COMPARISON:  None Available. FINDINGS: Lower chest: Lung bases clear Hepatobiliary: Gallbladder and liver normal appearance Pancreas: Normal appearance Spleen: Minimal capsular calcification which may reflect prior trauma or hemorrhage. No focal mass. Adrenals/Urinary Tract: Adrenal glands, kidneys, ureters, and bladder normal appearance Stomach/Bowel: Normal appendix. Fluid-filled large and small bowel loops consistent with history of diarrhea. Fluid present to rectum. Fluid within stomach. No definite evidence of bowel obstruction or wall thickening. Vascular/Lymphatic: Vascular structures patent. Numerous  normal sized mesenteric lymph nodes. Mild mesenteric hyperemia. No abdominal or pelvic adenopathy. Reproductive: Prostate gland and seminal vesicles unremarkable Other: Tiny umbilical hernia containing fat. No free air or free fluid. No inflammatory process. Musculoskeletal: Spina bifida occulta L5. No acute osseous findings. IMPRESSION: Fluid-filled large and small bowel loops consistent with history of diarrhea with associated numerous normal size mesenteric lymph nodes and slight mesenteric hyperemia, overall pattern suggesting enteritis. Tiny umbilical hernia containing fat. No additional intra-abdominal or intrapelvic abnormalities. Electronically Signed   By: Lavonia Dana M.D.   On: 09/21/2022 19:24   CT Head Wo Contrast  Result Date: 09/21/2022 CLINICAL DATA:  History of hydrocephalus. Nausea, vomiting and headache EXAM: CT HEAD WITHOUT CONTRAST TECHNIQUE: Contiguous axial images were obtained from the base of the skull through the vertex without intravenous contrast. RADIATION DOSE REDUCTION: This exam was performed according to the departmental dose-optimization program which includes automated exposure control, adjustment of the mA and/or kV according to patient size and/or use of iterative  reconstruction technique. COMPARISON:  CT head 07/15/2015 FINDINGS: Brain: Right parietal shunt catheter is unchanged in position. The lateral and third ventricles are slightly increased in size since 07/15/2015. For example at the level of the catheter tip the transverse diameter of the lateral ventricles measures 32 mm, previously 26 mm. No evidence of acute infarct. No intracranial hemorrhage Vascular: No hyperdense vessel or unexpected calcification. Skull: Normal. Negative for fracture or focal lesion. Sinuses/Orbits: No acute finding. Other: None. IMPRESSION: 1. No acute intracranial abnormality. 2. The lateral and third ventricles are slightly increased in size since 07/15/2015. Right parietal shunt catheter is unchanged in position. Recommend correlation with shunt function. Electronically Signed   By: Placido Sou M.D.   On: 09/21/2022 19:21    Procedures Procedures   Medications Ordered in ED Medications  lactated ringers bolus 2,000 mL (0 mLs Intravenous Stopped 09/21/22 1708)  ondansetron (ZOFRAN) injection 4 mg (4 mg Intravenous Given 09/21/22 1559)  morphine (PF) 4 MG/ML injection 4 mg (4 mg Intravenous Given 09/21/22 1559)  metoCLOPramide (REGLAN) injection 10 mg (10 mg Intravenous Given 09/21/22 1826)  iohexol (OMNIPAQUE) 350 MG/ML injection 100 mL (80 mLs Intravenous Contrast Given 09/21/22 1852)  lactated ringers bolus 1,000 mL (0 mLs Intravenous Stopped 09/21/22 2021)    ED Course/ Medical Decision Making/ A&P                             Medical Decision Making Amount and/or Complexity of Data Reviewed Labs: ordered. Radiology: ordered.  Risk Prescription drug management.   Jaquain Applegate is a 27 y.o. male with comorbidities that complicate the patient evaluation including insulin-dependent diabetes and prior VP shunt who presents emergency department with fever, nausea, vomiting, diarrhea, and abdominal pain  Initial Ddx:  Gastroenteritis, bowel obstruction,  appendicitis, hydrocephalus, cannabinoid hyperemesis  MDM:  Feel the patient likely has gastroenteritis based on his symptoms.  Is already had negative COVID and flu.  Does have some mild abdominal distention but no significant tenderness to palpation on exam making bowel obstruction less likely.  Also considered appendicitis but since he is not having any right lower quadrant pain feel this is less likely as well.  No severe headache that would indicate hydrocephalus.  Not using marijuana.  Plan:  Labs Lipase Urinalysis IV fluids Antiemetics  ED Summary/Re-evaluation:  Patient reassessed and remained persistently tachycardic in the emergency department.  Received a total of 3 L of fluids with only mild  improvement of his tachycardia.  Upon chart review did have tachycardia prior doctors appointments so suspect that there could be a component of anxiety to this.  CT scan was obtained after he was having persistent nausea despite conservative treatment and so decision was made to obtain CT scan of the head to assess for worsening hydrocephalus as well as the abdomen pelvis to assess for any intra-abdominal process.  It showed that he had gastroenteritis and slight interval increase of size of his ventricles.  Given these findings instructed he and his father to follow-up with neurosurgery in the next 1 to 2 weeks as I do not suspect that this is the cause of his symptoms but may require more frequent monitoring.  Was already given Zofran at a prior appointment that he will take.  This patient presents to the ED for concern of complaints listed in HPI, this involves an extensive number of treatment options, and is a complaint that carries with it a high risk of complications and morbidity. Disposition including potential need for admission considered.   Dispo: DC Home. Return precautions discussed including, but not limited to, those listed in the AVS. Allowed pt time to ask questions which were  answered fully prior to dc.  Additional history obtained from mother Records reviewed Outpatient Clinic Notes The following labs were independently interpreted: Chemistry and show  hyperglycemia without evidence of DKA and nonanion gap acidosis consistent with GI losses I independently reviewed the following imaging with scope of interpretation limited to determining acute life threatening conditions related to emergency care: CT Abdomen/Pelvis and agree with the radiologist interpretation with the following exceptions: None I personally reviewed and interpreted cardiac monitoring: sinus tachycardia I personally reviewed and interpreted the pt's EKG: see above for interpretation  I have reviewed the patients home medications and made adjustments as needed  Final Clinical Impression(s) / ED Diagnoses Final diagnoses:  Gastroenteritis  History of hydrocephalus  Tachycardia    Rx / DC Orders ED Discharge Orders     None         Fransico Meadow, MD 09/21/22 2036

## 2022-09-21 NOTE — Discharge Instructions (Signed)
You were seen for your viral bug (gastroenteritis) in the emergency department.  At home, please take the Zofran for your nausea and vomiting. Please be sure to stay well-hydrated.  Follow-up with your primary doctor in 2-3 days regarding your visit.  Return immediately to the emergency department if you experience any of the following: fainting, abdominal pain, high fevers, or any other concerning symptoms.  Thank you for visiting our Emergency Department. It was a pleasure taking care of you today.

## 2022-09-21 NOTE — ED Notes (Signed)
Patient back from CT.

## 2022-09-21 NOTE — ED Triage Notes (Signed)
Patient here POV from Home.  Endorses Emesis and Back Pain that began 2 Days ago. Assessed by PCP then and given Zofran   Began to have Diarrhea that PM. Nausea somewhat became better until this AM in which he began to have emesis again. 100.6 Temp at Home today.  History of T1DM. BG has been 200s at Home.   NAD Noted during Triage. A&Ox4. GCS 15. Ambulatory.

## 2022-09-21 NOTE — ED Notes (Signed)
Patient verbalizes understanding of discharge instructions. Opportunity for questioning and answers were provided. Armband removed by staff, pt discharged from ED. Ambulated out to lobby with father

## 2022-09-21 NOTE — ED Notes (Signed)
Patient transported to CT
